# Patient Record
Sex: Female | Born: 1980 | Race: Black or African American | Hispanic: No | Marital: Married | Smoking: Never smoker
Health system: Southern US, Community
[De-identification: ages and names within clinical notes are randomized; demographics above are authoritative.]

## PROBLEM LIST (undated history)

## (undated) DIAGNOSIS — M6080 Other myositis, unspecified site: Secondary | ICD-10-CM

## (undated) DIAGNOSIS — M6089 Other myositis, multiple sites: Secondary | ICD-10-CM

---

## 2016-11-08 DIAGNOSIS — R109 Unspecified abdominal pain: Secondary | ICD-10-CM | POA: Insufficient documentation

## 2016-11-09 ENCOUNTER — Emergency Department (HOSPITAL_COMMUNITY): Payer: 59

## 2016-11-09 ENCOUNTER — Emergency Department (HOSPITAL_COMMUNITY)
Admission: EM | Admit: 2016-11-09 | Discharge: 2016-11-09 | Disposition: A | Discharge status: Home / Self Care | Payer: 59 | Attending: Emergency Medicine | Admitting: Emergency Medicine

## 2016-11-09 ENCOUNTER — Encounter (HOSPITAL_COMMUNITY): Payer: Self-pay | Admitting: Emergency Medicine

## 2016-11-09 DIAGNOSIS — R109 Unspecified abdominal pain: Secondary | ICD-10-CM

## 2016-11-09 HISTORY — DX: Other myositis, multiple sites: M60.89

## 2016-11-09 HISTORY — DX: Other myositis, unspecified site: M60.80

## 2016-11-09 LAB — URINALYSIS, ROUTINE W REFLEX MICROSCOPIC
Bilirubin Urine: NEGATIVE
GLUCOSE, UA: NEGATIVE mg/dL
Ketones, ur: NEGATIVE mg/dL
Leukocytes, UA: NEGATIVE
NITRITE: NEGATIVE
PH: 5 (ref 5.0–8.0)
PROTEIN: NEGATIVE mg/dL
Specific Gravity, Urine: 1.014 (ref 1.005–1.030)

## 2016-11-09 LAB — POC URINE PREG, ED: Preg Test, Ur: NEGATIVE

## 2016-11-09 MED ORDER — PREDNISONE 20 MG PO TABS: 40.0000 mg | ORAL_TABLET | Freq: Every day | ORAL | 0 refills | Status: AC

## 2016-11-09 NOTE — ED Notes (Signed)
Patient transported to X-ray 

## 2016-11-09 NOTE — ED Provider Notes (Signed)
[facility]-EMERGENCY DEPT Provider Note   CSN: [id number] Arrival date & time: 11/08/16  2322  By signing my name below, I, Elder Negus, attest that this documentation has been prepared under the direction and in the presence of Shon Baton, MD. Electronically Signed: Elder Negus, Scribe. 11/09/16. 2:46 AM.   History   Chief Complaint Chief Complaint  Patient presents with  . Flank Pain    radiating to back    HPI Jacqueline Sanford is a 36 y.o. 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 This patient states that 3 days ago she had sudden onset of R flank pain which "radiates into her mid back" and was constant since that time. While in triage she believes that her symptoms have improved greatly and is now only reporting mild pain. She believes her complaints are consistent with her typicals "flares" Of her disease. She also reports bilateral leg discomfort which has been going on for similar amount of time. Currently she states that she is comfortable and much improved. She normally receives his tapers of steroids for her symptoms. She denies any fevers, dysuria, or hematuria. She denies any history of nephrolithiasis.   The history is provided by the patient. No language interpreter was used.    Past Medical History:  Diagnosis Date  . Neuromyositis     There are no active problems to display for this patient.   Past Surgical History:  Procedure Laterality Date  . CESAREAN SECTION      OB History    Gravida Para Term Preterm AB Living   4 4 4  0 0 4   SAB TAB Ectopic Multiple Live Births                   Home Medications    Prior to Admission medications   Medication Sig Start Date End Date Taking? Authorizing Provider  azaTHIOprine (IMURAN) 50 MG tablet Take 50 mg by mouth 3 (three) times daily.   Yes Historical Provider, MD  drospirenone-ethinyl estradiol  (YAZ,GIANVI,LORYNA) 3-0.02 MG tablet Take 1 tablet by mouth daily.   Yes Historical Provider, MD  gabapentin (NEURONTIN) 300 MG capsule Take 300 mg by mouth 4 (four) times daily.   Yes Historical Provider, MD  predniSONE (DELTASONE) 20 MG tablet Take 2 tablets (40 mg total) by mouth daily. 11/09/16   Shon Baton, MD    Family History No family history on file.  Social History Social History  Substance Use Topics  . Smoking status: Never Smoker  . Smokeless tobacco: Never Used  . Alcohol use Yes     Allergies   Patient has no known allergies.   Review of Systems Review of Systems  Constitutional: Negative for fever.  Respiratory: Negative for shortness of breath.   Cardiovascular: Negative for chest pain.  Genitourinary: Positive for flank pain. Negative for difficulty urinating, dysuria and hematuria.  Musculoskeletal: Positive for back pain.  All other systems reviewed and are negative.    Physical Exam Updated Vital Signs BP (!) 148/106 (BP Location: Right Arm)   Pulse 72   Temp 98.8 F (37.1 C) (Oral)   Resp 18   Ht 5' (1.524 m)   Wt 124 lb (56.2 kg)   LMP 10/09/2016 Comment: neg preg. test  SpO2 100%   BMI 24.22 kg/m   Physical Exam  Constitutional: She is oriented to person, place, and time. She appears well-developed and well-nourished. No  distress.  HENT:  Head: Normocephalic and atraumatic.  Cardiovascular: Normal rate, regular rhythm and normal heart sounds.   Pulmonary/Chest: Effort normal. No respiratory distress. She has no wheezes.  Abdominal: Soft. Bowel sounds are normal. There is no tenderness.  Genitourinary:  Genitourinary Comments: No CVA tenderness  Neurological: She is alert and oriented to person, place, and time.  Skin: Skin is warm and dry.  Psychiatric: She has a normal mood and affect.  Nursing note and vitals reviewed.    ED Treatments / Results  DIAGNOSTIC STUDIES: Oxygen Saturation is 97 percent on room air which is  normal by my interpretation.    COORDINATION OF CARE: 2:30 AM Discussed treatment plan with pt at bedside and pt agreed to plan.  Labs (all labs ordered are listed, but only abnormal results are displayed) Labs Reviewed  URINALYSIS, ROUTINE W REFLEX MICROSCOPIC - Abnormal; Notable for the following:       Result Value   Hgb urine dipstick MODERATE (*)    Bacteria, UA RARE (*)    Squamous Epithelial / LPF 0-5 (*)    All other components within normal limits  POC URINE PREG, ED    EKG  EKG Interpretation None       Radiology Dg Abdomen Acute W/chest  Result Date: 11/09/2016 CLINICAL DATA:  Right flank pain radiating to the back for 24 hours. Nausea and vomiting. EXAM: DG ABDOMEN ACUTE W/ 1V CHEST COMPARISON:  None. FINDINGS: Normal heart size and pulmonary vascularity. No focal airspace disease or consolidation in the lungs. No blunting of costophrenic angles. No pneumothorax. Mediastinal contours appear intact. Scattered gas and stool in the colon. No small or large bowel distention. No free intra-abdominal air. No abnormal air-fluid levels. No radiopaque stones. Visualized bones appear intact. Bilateral tubal ligations. IMPRESSION: No evidence of active pulmonary disease. Normal nonobstructive bowel gas pattern. Electronically Signed   By: Burman Nieves M.D.   On: 11/09/2016 03:20    Procedures Procedures (including critical care time)  Medications Ordered in ED Medications - No data to display   Initial Impression / Assessment and Plan / ED Course  I have reviewed the triage vital signs and the nursing notes.  Pertinent labs & imaging results that were available during my care of the patient were reviewed by me and considered in my medical decision making (see chart for details).     Patient presents with right-sided back and flank pain. Reports this is consistent with prior flares of her autoimmune disease and she feels much better. She is otherwise  nontoxic-appearing. Urinalysis without evidence of infection. Acute abdominal series shows no evidence of stone. She has remained asymptomatic while in the ED. I have provided her with a course of prednisone per her request. I have encouraged her to follow-up with her neurologist prior to starting the prednisone. Patient stated understanding.  After history, exam, and medical workup I feel the patient has been appropriately medically screened and is safe for discharge home. Pertinent diagnoses were discussed with the patient. Patient was given return precautions.   Final Clinical Impressions(s) / ED Diagnoses   Final diagnoses:  Right flank pain    New Prescriptions New Prescriptions   PREDNISONE (DELTASONE) 20 MG TABLET    Take 2 tablets (40 mg total) by mouth daily.  *  I personally performed the services described in this documentation, which was scribed in my presence. The recorded information has been reviewed and is accurate.    Shon Baton, MD 11/09/16  0424  

## 2016-11-09 NOTE — ED Triage Notes (Signed)
PMH of NMO auto-immune disorder, pt tonight c/o right flank pain that radiates to back onset x3 days with onset of N/V tonight and pt feels this is related to her NMO immune disorder

## 2016-11-09 NOTE — ED Notes (Signed)
ED Provider at bedside. 

## 2016-11-09 NOTE — Discharge Instructions (Signed)
You were seen today for right back and flank pain. Consult with your neurologist prior to starting prednisone for presumed flare of your autoimmune disease. If you have any new or worsening symptoms she should be reevaluated.

## 2016-11-15 ENCOUNTER — Ambulatory Visit (HOSPITAL_COMMUNITY)
Admission: RE | Admit: 2016-11-15 | Discharge: 2016-11-15 | Disposition: A | Discharge status: Home / Self Care | Payer: 59 | Source: Ambulatory Visit | Attending: Family Medicine | Admitting: Family Medicine

## 2016-11-15 DIAGNOSIS — G35 Multiple sclerosis: Secondary | ICD-10-CM | POA: Diagnosis not present

## 2016-11-15 MED ORDER — SODIUM CHLORIDE 0.9 % IV SOLN
1000.0000 mg | Freq: Once | INTRAVENOUS | Status: AC
  Administered 2016-11-15: 1000 mg via INTRAVENOUS
  Filled 2016-11-15: qty 8

## 2016-11-15 NOTE — Progress Notes (Signed)
Provider: Marcy Siren  Procedure: methylPREDNISolone sodium succinate (SOLU-MEDROL) 1,000 mg in sodium chloride 0.9 % 50 mL IVPB   Diagnosis: Multiple Sclerosis G35  Treatment: Patient received 1,000 mg of Solu-Medrol via IVPB. Patient tolerated procedure well. Discharge instructions given to patient and patient states an understanding. Patient alert, oriented, and ambulatory at time of discharge.

## 2016-11-15 NOTE — Discharge Instructions (Signed)
Pt received Solu Medrol today.

## 2016-11-16 ENCOUNTER — Ambulatory Visit (HOSPITAL_COMMUNITY)
Admission: RE | Admit: 2016-11-16 | Discharge: 2016-11-16 | Disposition: A | Discharge status: Home / Self Care | Payer: 59 | Source: Ambulatory Visit | Attending: Family Medicine | Admitting: Family Medicine

## 2016-11-16 DIAGNOSIS — G35 Multiple sclerosis: Secondary | ICD-10-CM | POA: Diagnosis not present

## 2016-11-16 MED ORDER — SODIUM CHLORIDE 0.9 % IV SOLN
1000.0000 mg | Freq: Once | INTRAVENOUS | Status: AC
  Administered 2016-11-16: 1000 mg via INTRAVENOUS
  Filled 2016-11-16: qty 8

## 2016-11-16 NOTE — Progress Notes (Signed)
Provider: Sun, V  Procedure: methylPREDNISolone sodium succinate (SOLU-MEDROL) 1,000 mg in sodium chloride 0.9 % 50 mL IVPB   Diagnosis: Multiple Sclerosis G35  Treatment: Patient received 1,000 mg of Solu-Medrol via IVPB. Patient tolerated procedure well. Discharge instructions given to patient and patient states an understanding. Patient alert, oriented, and ambulatory at time of discharge.   

## 2016-11-16 NOTE — Discharge Instructions (Signed)
Patient received 1,000 mg of Solu-Medrol via IVPB.

## 2016-11-17 ENCOUNTER — Ambulatory Visit (HOSPITAL_COMMUNITY)
Admission: RE | Admit: 2016-11-17 | Discharge: 2016-11-17 | Disposition: A | Discharge status: Home / Self Care | Payer: 59 | Source: Ambulatory Visit | Attending: Family Medicine | Admitting: Family Medicine

## 2016-11-17 DIAGNOSIS — G35 Multiple sclerosis: Secondary | ICD-10-CM | POA: Diagnosis not present

## 2016-11-17 MED ORDER — METHYLPREDNISOLONE SODIUM SUCC 1000 MG IJ SOLR
1000.0000 mg | Freq: Once | INTRAMUSCULAR | Status: AC
  Administered 2016-11-17: 1000 mg via INTRAVENOUS
  Filled 2016-11-17: qty 8

## 2016-11-17 NOTE — Procedures (Signed)
[facility]  Procedure Note  Legend Carbonaro UEA:[id number] DOB: 1981-01-28 DOA: 11/17/2016   Ordering Provider: Trudie Buckler, MD  Associated Diagnosis: MS G35  Procedure Note: IV infusion of solumedrol, 1000 mg   Condition During Procedure: Pt tolerated well; no complications noted   Condition at Discharge:  Pt alert and oriented; ambulatory; no complications noted   Bluford Kaufmann, RN  [facility]

## 2016-11-18 ENCOUNTER — Ambulatory Visit (HOSPITAL_COMMUNITY)
Admission: RE | Admit: 2016-11-18 | Discharge: 2016-11-18 | Disposition: A | Discharge status: Home / Self Care | Payer: 59 | Source: Ambulatory Visit | Attending: Family Medicine | Admitting: Family Medicine

## 2016-11-18 DIAGNOSIS — G35 Multiple sclerosis: Secondary | ICD-10-CM | POA: Diagnosis not present

## 2016-11-18 MED ORDER — SODIUM CHLORIDE 0.9 % IV SOLN
1000.0000 mg | Freq: Once | INTRAVENOUS | Status: AC
  Administered 2016-11-18: 1000 mg via INTRAVENOUS
  Filled 2016-11-18: qty 8

## 2016-11-18 NOTE — Progress Notes (Addendum)
Provider: Douglas Jeffery, MD  Procedure: methylPREDNISolone sodium succinate (SOLU-MEDROL) 1,000 mg in sodium chloride 0.9 % 50 mL IVPB   Diagnosis: Multiple Sclerosis G35  Treatment: Patient received 1,000 mg of Solu-Medrol via IVPB. Patient tolerated procedure well. Discharge instructions given to patient and patient states an understanding. Patient alert, oriented, and ambulatory at time of discharge. 

## 2016-11-18 NOTE — Discharge Instructions (Signed)
Pt received 1000mg  of SoluMedro today via IV.  It is used for: Acute exacerbations of multiple sclerosis; cerebral edema associated with primary or metastatic brain tumor, or craniotomy.

## 2016-11-21 ENCOUNTER — Ambulatory Visit (HOSPITAL_COMMUNITY)
Admission: RE | Admit: 2016-11-21 | Discharge: 2016-11-21 | Disposition: A | Discharge status: Home / Self Care | Payer: 59 | Source: Ambulatory Visit | Attending: Family Medicine | Admitting: Family Medicine

## 2016-11-21 DIAGNOSIS — G35 Multiple sclerosis: Secondary | ICD-10-CM | POA: Diagnosis not present

## 2016-11-21 MED ORDER — METHYLPREDNISOLONE SODIUM SUCC 1000 MG IJ SOLR
1000.0000 mg | Freq: Once | INTRAMUSCULAR | Status: AC
  Administered 2016-11-21: 1000 mg via INTRAVENOUS
  Filled 2016-11-21: qty 8

## 2016-11-21 NOTE — Progress Notes (Signed)
Provider: Trudie Buckler, MD  Procedure: methylPREDNISolone sodium succinate (SOLU-MEDROL) 1,000 mg in sodium chloride 0.9 % 50 mL IVPB   Diagnosis: Multiple Sclerosis G35  Treatment: Patient received 1,000 mg of Solu-Medrol via IVPB. Patient tolerated procedure well. Discharge instructions given to patient and patient states an understanding. Patient alert, oriented, and ambulatory at time of discharge.

## 2016-11-21 NOTE — Discharge Instructions (Signed)
Pt received 1000 mg of Solu Medro today via piv.

## 2017-01-09 ENCOUNTER — Other Ambulatory Visit: Payer: Self-pay | Admitting: Psychiatry

## 2017-01-09 DIAGNOSIS — G35 Multiple sclerosis: Secondary | ICD-10-CM

## 2017-01-11 ENCOUNTER — Other Ambulatory Visit: Payer: Self-pay | Admitting: Psychiatry

## 2017-01-11 DIAGNOSIS — G35 Multiple sclerosis: Secondary | ICD-10-CM

## 2017-01-20 ENCOUNTER — Ambulatory Visit
Admission: RE | Admit: 2017-01-20 | Discharge: 2017-01-20 | Disposition: A | Discharge status: Home / Self Care | Payer: 59 | Source: Ambulatory Visit | Attending: Psychiatry | Admitting: Psychiatry

## 2017-01-20 DIAGNOSIS — G35 Multiple sclerosis: Secondary | ICD-10-CM

## 2017-01-20 MED ORDER — GADOBENATE DIMEGLUMINE 529 MG/ML IV SOLN
15.0000 mL | Freq: Once | INTRAVENOUS | Status: AC | PRN
  Administered 2017-01-20: 12 mL via INTRAVENOUS

## 2017-01-23 ENCOUNTER — Ambulatory Visit
Admission: RE | Admit: 2017-01-23 | Discharge: 2017-01-23 | Disposition: A | Discharge status: Home / Self Care | Payer: 59 | Source: Ambulatory Visit | Attending: Psychiatry | Admitting: Psychiatry

## 2017-01-23 DIAGNOSIS — G35 Multiple sclerosis: Secondary | ICD-10-CM

## 2017-01-23 MED ORDER — GADOBENATE DIMEGLUMINE 529 MG/ML IV SOLN
12.0000 mL | Freq: Once | INTRAVENOUS | Status: AC | PRN
  Administered 2017-01-23: 12 mL via INTRAVENOUS

## 2017-01-30 ENCOUNTER — Ambulatory Visit (HOSPITAL_COMMUNITY)
Admission: RE | Admit: 2017-01-30 | Discharge: 2017-01-30 | Disposition: A | Discharge status: Home / Self Care | Payer: 59 | Source: Ambulatory Visit | Attending: Family Medicine | Admitting: Family Medicine

## 2017-01-30 DIAGNOSIS — G35 Multiple sclerosis: Secondary | ICD-10-CM | POA: Insufficient documentation

## 2017-01-30 MED ORDER — SODIUM CHLORIDE 0.9 % IV SOLN
1000.0000 mg | Freq: Once | INTRAVENOUS | Status: AC
  Administered 2017-01-30: 1000 mg via INTRAVENOUS
  Filled 2017-01-30: qty 8

## 2017-01-30 NOTE — Discharge Instructions (Signed)
Today, you received an IV infusion of Solumedrol, 1000 mg; Please see referring physician for further instructions.

## 2017-01-30 NOTE — Progress Notes (Signed)
Pt arrived for IV infusion of Solumedrol, 1000 mg; Pt tolerated well; no complications noted  Ordering Provider: Trudie Buckler, MD  Diagnosis: MS exacerbation

## 2017-01-31 ENCOUNTER — Ambulatory Visit (HOSPITAL_COMMUNITY)
Admission: RE | Admit: 2017-01-31 | Discharge: 2017-01-31 | Disposition: A | Discharge status: Home / Self Care | Payer: 59 | Source: Ambulatory Visit | Attending: Family Medicine | Admitting: Family Medicine

## 2017-01-31 DIAGNOSIS — G35 Multiple sclerosis: Secondary | ICD-10-CM | POA: Diagnosis not present

## 2017-01-31 MED ORDER — SODIUM CHLORIDE 0.9 % IV SOLN
1000.0000 mg | Freq: Once | INTRAVENOUS | Status: AC
  Administered 2017-01-31: 1000 mg via INTRAVENOUS
  Filled 2017-01-31: qty 8

## 2017-01-31 NOTE — Progress Notes (Signed)
Ordering Provider: Douglas Jeffery, MD  Procedure: methylPREDNISolone sodium succinate (SOLU-MEDROL) 1,000 mg in sodium chloride 0.9 % 50 mL IVPB   Diagnosis: Multiple Sclerosis G35  Treatment: Patient received 1,000 mg of Solu-Medrol via IVPB. Patient tolerated procedure well. Discharge instructions given to patient and patient states an understanding. Patient alert, oriented, and ambulatory at time of discharge. 

## 2017-01-31 NOTE — Discharge Instructions (Signed)
Patient received Solumedrol via IVPB.

## 2017-02-01 ENCOUNTER — Ambulatory Visit (HOSPITAL_COMMUNITY)
Admission: RE | Admit: 2017-02-01 | Discharge: 2017-02-01 | Disposition: A | Discharge status: Home / Self Care | Payer: 59 | Source: Ambulatory Visit | Attending: Family Medicine | Admitting: Family Medicine

## 2017-02-01 DIAGNOSIS — G35 Multiple sclerosis: Secondary | ICD-10-CM | POA: Diagnosis not present

## 2017-02-01 MED ORDER — SODIUM CHLORIDE 0.9 % IV SOLN
1000.0000 mg | Freq: Once | INTRAVENOUS | Status: AC
  Administered 2017-02-01: 1000 mg via INTRAVENOUS
  Filled 2017-02-01: qty 8

## 2017-02-01 NOTE — Discharge Instructions (Signed)
Patient received methylPREDNISolone sodium succinate 1000 MG via IVPB.

## 2017-02-01 NOTE — Progress Notes (Signed)
Ordering Provider: Trudie Buckler, MD  Procedure: methylPREDNISolone sodium succinate (SOLU-MEDROL) 1,000 mg in sodium chloride 0.9 % 50 mL IVPB   Diagnosis: Multiple Sclerosis G35  Treatment: Patient received 1,000 mg of Solu-Medrol via IVPB. Patient tolerated procedure well. Discharge instructions given to patient and patient states an understanding. Patient alert, oriented, and ambulatory at time of discharge.

## 2017-02-02 ENCOUNTER — Ambulatory Visit (HOSPITAL_COMMUNITY)
Admission: RE | Admit: 2017-02-02 | Discharge: 2017-02-02 | Disposition: A | Discharge status: Home / Self Care | Payer: 59 | Source: Ambulatory Visit | Attending: Family Medicine | Admitting: Family Medicine

## 2017-02-02 DIAGNOSIS — G35 Multiple sclerosis: Secondary | ICD-10-CM | POA: Diagnosis not present

## 2017-02-02 MED ORDER — SODIUM CHLORIDE 0.9 % IV SOLN
1000.0000 mg | Freq: Once | INTRAVENOUS | Status: AC
  Administered 2017-02-02: 1000 mg via INTRAVENOUS
  Filled 2017-02-02: qty 8

## 2017-02-02 NOTE — Discharge Instructions (Signed)
Patient received methylPREDNISolone sodium succinate (SOLU-MEDROL) 1,000 mg in sodium chloride 0.9 % 50 mL IVPB.

## 2017-02-02 NOTE — Progress Notes (Signed)
Ordering Provider: Douglas Jeffery, MD  Procedure: methylPREDNISolone sodium succinate (SOLU-MEDROL) 1,000 mg in sodium chloride 0.9 % 50 mL IVPB   Diagnosis: Multiple Sclerosis G35  Treatment: Patient received 1,000 mg of Solu-Medrol via IVPB. Patient tolerated procedure well. Discharge instructions given to patient and patient states an understanding. Patient alert, oriented, and ambulatory at time of discharge. 

## 2017-02-03 ENCOUNTER — Ambulatory Visit (HOSPITAL_COMMUNITY)
Admission: RE | Admit: 2017-02-03 | Discharge: 2017-02-03 | Disposition: A | Discharge status: Home / Self Care | Payer: 59 | Source: Ambulatory Visit | Attending: Family Medicine | Admitting: Family Medicine

## 2017-02-03 DIAGNOSIS — G35 Multiple sclerosis: Secondary | ICD-10-CM | POA: Diagnosis not present

## 2017-02-03 MED ORDER — SODIUM CHLORIDE 0.9 % IV SOLN
1000.0000 mg | Freq: Once | INTRAVENOUS | Status: AC
  Administered 2017-02-03: 1000 mg via INTRAVENOUS
  Filled 2017-02-03: qty 8

## 2017-02-03 NOTE — Progress Notes (Signed)
Ordering Provider: Douglas Jeffery, MD  Procedure: methylPREDNISolone sodium succinate (SOLU-MEDROL) 1,000 mg in sodium chloride 0.9 % 50 mL IVPB   Diagnosis: Multiple Sclerosis G35  Treatment: Patient received 1,000 mg of Solu-Medrol via IVPB. Patient tolerated procedure well. Discharge instructions given to patient and patient states an understanding. Patient alert, oriented, and ambulatory at time of discharge. 

## 2017-02-03 NOTE — Discharge Instructions (Signed)
Patient received 1000 mg of SoluMedrol today via PIV.

## 2017-03-06 ENCOUNTER — Other Ambulatory Visit: Payer: Self-pay | Admitting: Gastroenterology

## 2017-03-06 DIAGNOSIS — R1013 Epigastric pain: Secondary | ICD-10-CM

## 2017-03-13 ENCOUNTER — Ambulatory Visit
Admission: RE | Admit: 2017-03-13 | Discharge: 2017-03-13 | Disposition: A | Discharge status: Home / Self Care | Payer: 59 | Source: Ambulatory Visit | Attending: Gastroenterology | Admitting: Gastroenterology

## 2017-03-13 DIAGNOSIS — R1013 Epigastric pain: Secondary | ICD-10-CM

## 2017-10-30 DIAGNOSIS — G360 Neuromyelitis optica [Devic]: Secondary | ICD-10-CM | POA: Diagnosis not present

## 2017-11-27 DIAGNOSIS — D729 Disorder of white blood cells, unspecified: Secondary | ICD-10-CM | POA: Diagnosis not present

## 2017-11-27 DIAGNOSIS — Z79899 Other long term (current) drug therapy: Secondary | ICD-10-CM | POA: Diagnosis not present

## 2018-01-10 DIAGNOSIS — Z79899 Other long term (current) drug therapy: Secondary | ICD-10-CM | POA: Diagnosis not present

## 2018-01-10 DIAGNOSIS — D729 Disorder of white blood cells, unspecified: Secondary | ICD-10-CM | POA: Diagnosis not present

## 2018-01-15 DIAGNOSIS — Z9225 Personal history of immunosupression therapy: Secondary | ICD-10-CM | POA: Diagnosis not present

## 2018-01-15 DIAGNOSIS — Z79899 Other long term (current) drug therapy: Secondary | ICD-10-CM | POA: Diagnosis not present

## 2018-01-22 DIAGNOSIS — Z5111 Encounter for antineoplastic chemotherapy: Secondary | ICD-10-CM | POA: Diagnosis not present

## 2018-01-22 DIAGNOSIS — G360 Neuromyelitis optica [Devic]: Secondary | ICD-10-CM | POA: Diagnosis not present

## 2018-01-22 DIAGNOSIS — Z9225 Personal history of immunosupression therapy: Secondary | ICD-10-CM | POA: Diagnosis not present

## 2018-01-22 DIAGNOSIS — Z79899 Other long term (current) drug therapy: Secondary | ICD-10-CM | POA: Diagnosis not present

## 2018-02-05 DIAGNOSIS — G360 Neuromyelitis optica [Devic]: Secondary | ICD-10-CM | POA: Diagnosis not present

## 2018-02-05 DIAGNOSIS — Z5111 Encounter for antineoplastic chemotherapy: Secondary | ICD-10-CM | POA: Diagnosis not present

## 2018-02-11 IMAGING — MR MR HEAD WO/W CM
18 of 20 series · 29 of 48 positions shown · IV contrast (multihance)
Comparison: None available.

CLINICAL DATA: The  Difficulty with ambulation.

EXAM:
MRI HEAD WITHOUT AND WITH CONTRAST
MRI CERVICAL SPINE WITHOUT AND WITH CONTRAST
TECHNIQUE: Multiplanar, multiecho pulse sequences of the brain and surrounding
structures, and cervical spine, to include the craniocervical
junction and cervicothoracic junction, were obtained without and
with intravenous contrast.
CONTRAST:  12mL MULTIHANCE GADOBENATE DIMEGLUMINE 529 MG/ML IV SOLN

[Series 2: T1 · sagittal · 5.0mm · 0.45mm/px · 1 of 21 slices shown (1 of 3)]
[im 1/21]
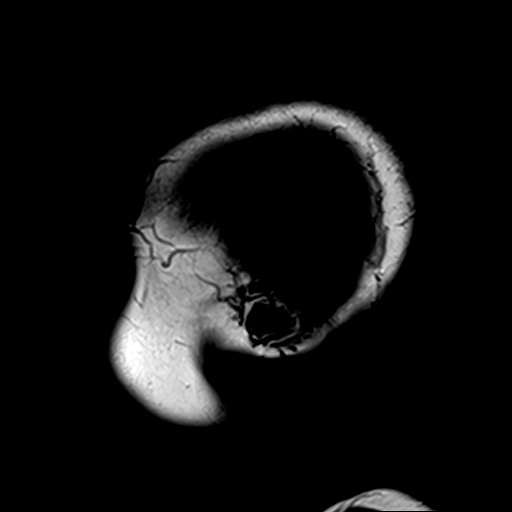

[Series 3: FLAIR · sagittal · 5.0mm · 0.45mm/px · 1 of 25 slices shown (1 of 2)]
[im 1/25]
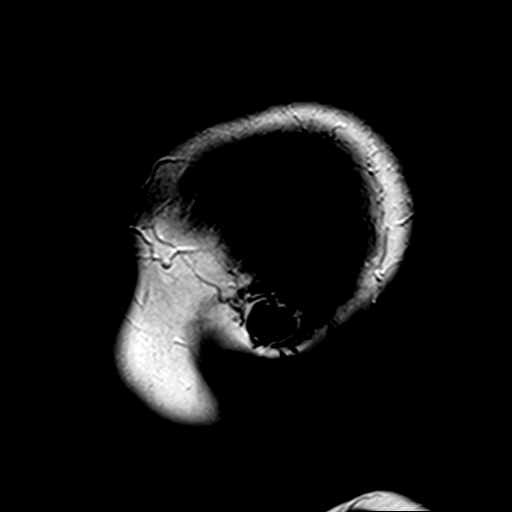

[Series 4: DWI · axial · 3.0mm · 1.80mm/px · z∈[-55,+86]mm · 5 of 96 slices shown (1 of 2)]
[im 1/96]
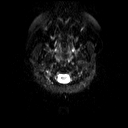
[im 24/96]
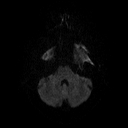
[im 48/96]
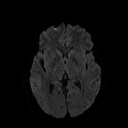
[im 72/96]
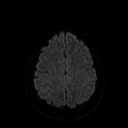
[im 96/96]
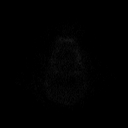

[Series 5: DWI · axial · 3.0mm · 1.80mm/px · z∈[-55,+86]mm · 2 of 48 slices shown (2 of 2)]
[im 1/48]
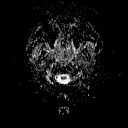
[im 48/48]
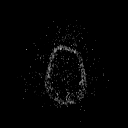

[Series 6: T2 · axial · 5.0mm · 0.51mm/px · 1 of 20 slices shown (1 of 5)]
[im 1/20]
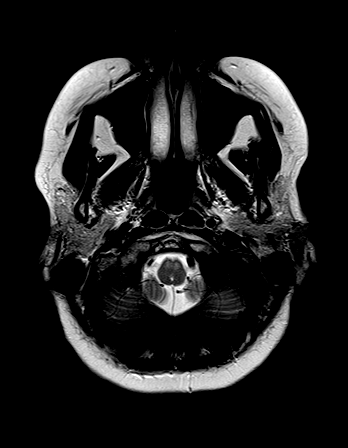

[Series 7: FLAIR · axial · 3.0mm · 0.45mm/px · 1 of 24 slices shown (2 of 2)]
[im 1/24]
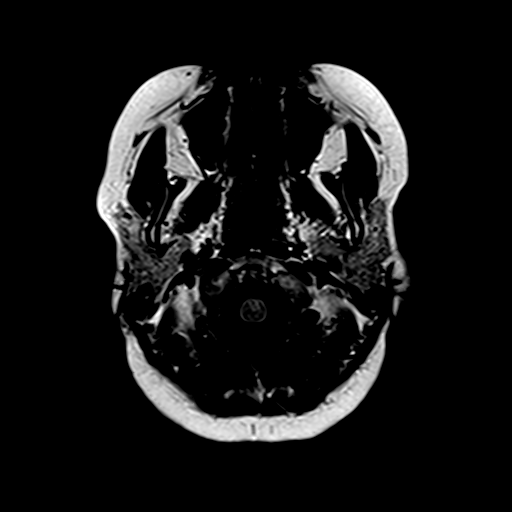

[Series 8: mip_images(sw) · axial · 40.0mm · 0.90mm/px · 1 of 21 slices shown]
[im 1/21]
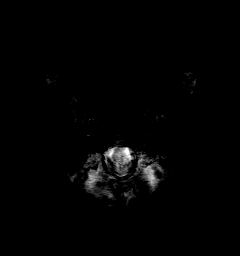

[Series 9: swi_images · axial · 5.0mm · 0.90mm/px · z∈[-52,+82]mm · 2 of 28 slices shown]
[im 1/28]
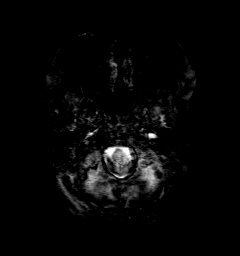
[im 28/28]
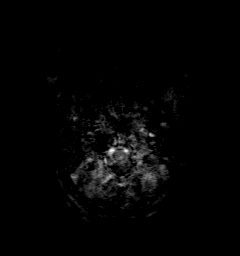

[Series 10: t1_mpr_tra · axial · 1.0mm · 0.75mm/px · 1 of 144 slices shown]
[im 1/144]
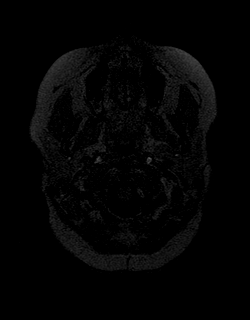

[Series 11: T2 · coronal · 5.0mm · 0.45mm/px · 2 of 27 slices shown (2 of 5)]
[im 1/27]
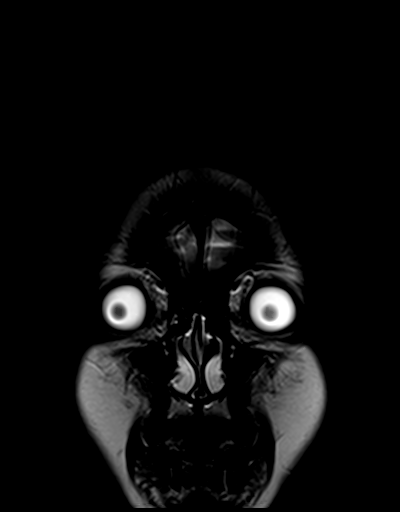
[im 27/27]
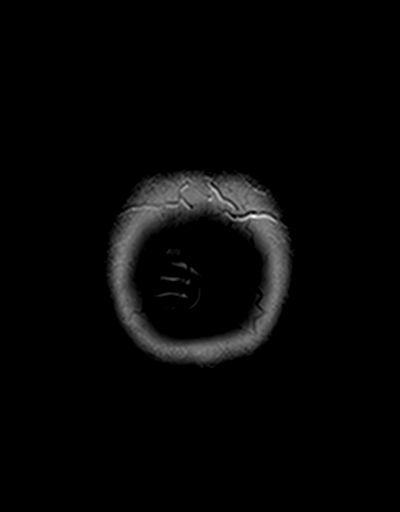

[Series 13: STIR · sagittal · 3.0mm · 0.82mm/px · 1 of 12 slices shown]
[im 1/12]
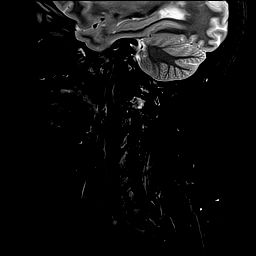

[Series 14: T1 · sagittal · 3.0mm · 0.41mm/px · 1 of 12 slices shown (2 of 3)]
[im 1/12]
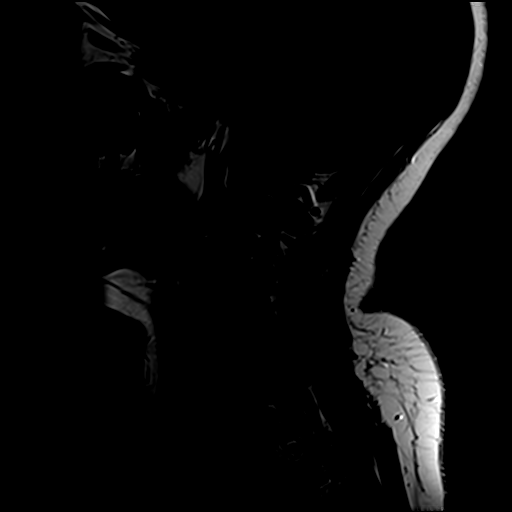

[Series 15: T2 · axial · 3.0mm · 0.39mm/px · z∈[-187,-95]mm · 2 of 26 slices shown (3 of 5)]
[im 1/26]
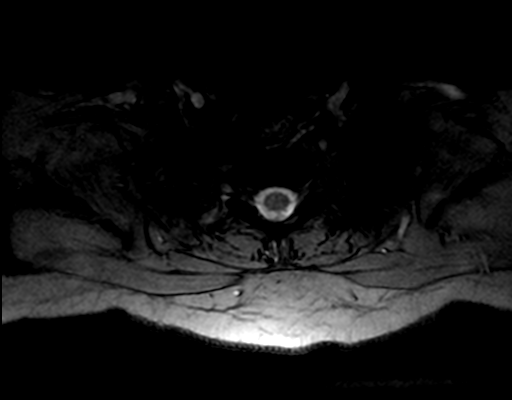
[im 26/26]
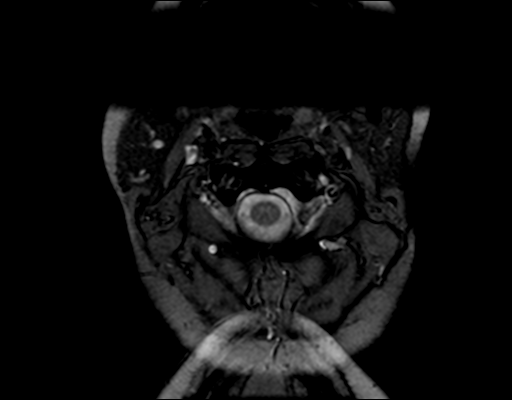

[Series 16: T2 · axial · 3.0mm · 0.39mm/px · z∈[-187,-96]mm · 2 of 26 slices shown (4 of 5)]
[im 1/26]
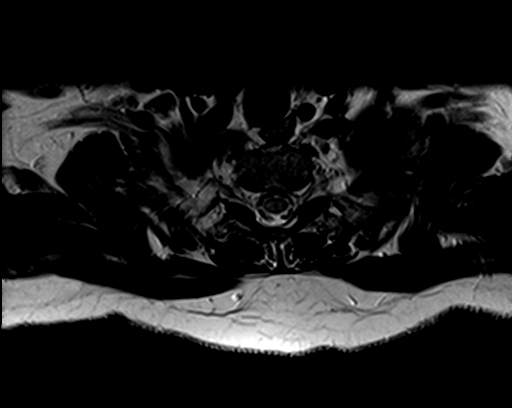
[im 26/26]
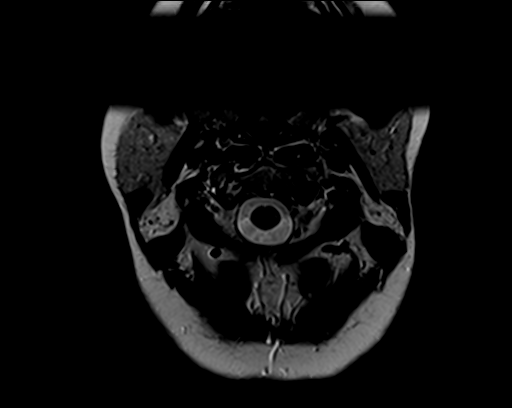

[Series 17: T1 · axial · 3.0mm · 0.35mm/px · z∈[-190,-99]mm · 2 of 26 slices shown (3 of 3)]
[im 1/26]
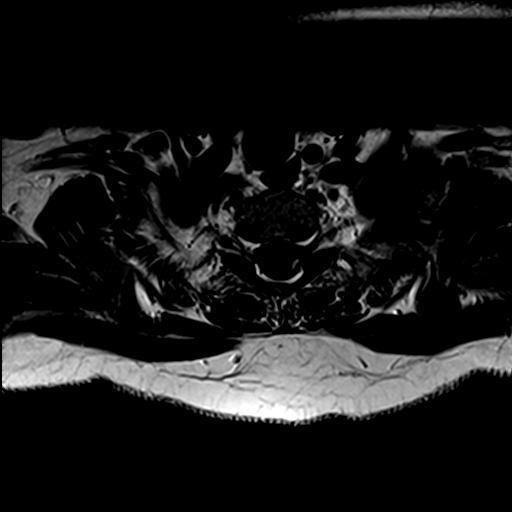
[im 26/26]
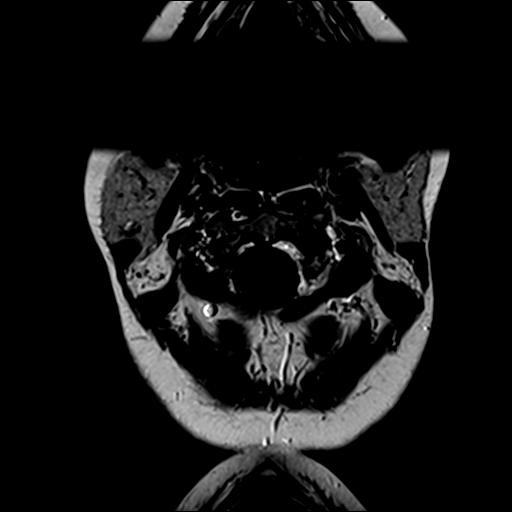

[Series 18: T2 · sagittal · 3.0mm · 0.41mm/px · 1 of 12 slices shown (5 of 5)]
[im 1/12]
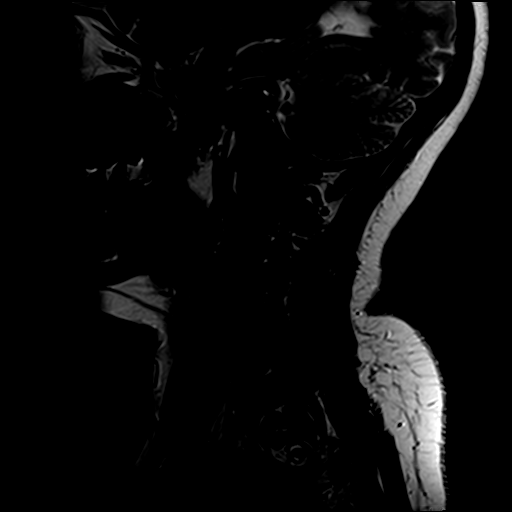

[Series 19: T1 fat-sat post-contrast · sagittal · 3.0mm · 0.82mm/px · 1 of 12 slices shown]
[im 1/12]
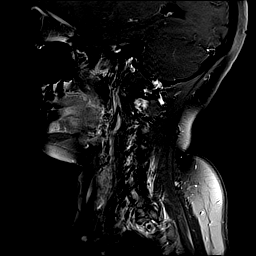

[Series 20: T1 post-contrast · axial · 3.0mm · 0.35mm/px · z∈[-190,-99]mm · 2 of 26 slices shown]
[im 1/26]
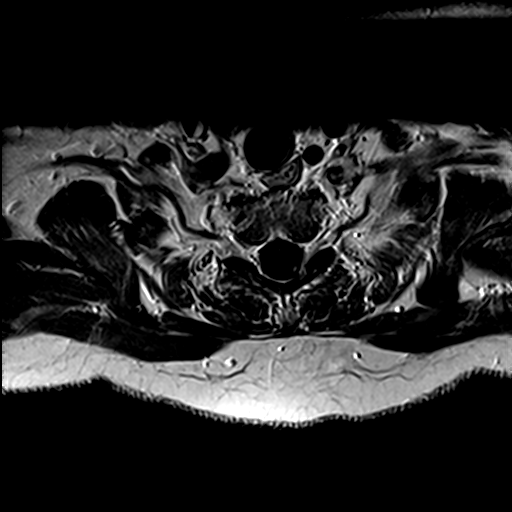
[im 26/26]
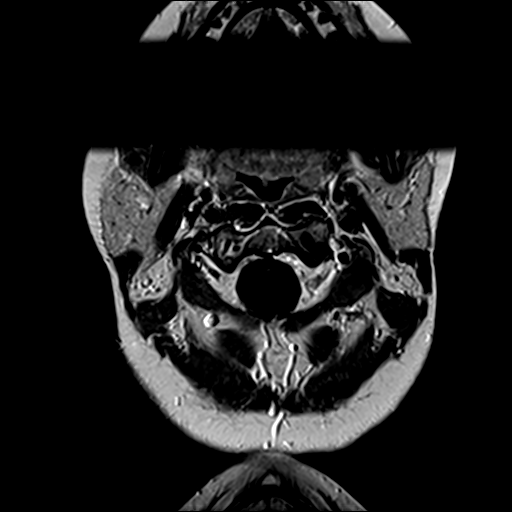

[29 of 48 positions shown; findings below may reference images not displayed]

FINDINGS: MRI HEAD FINDINGS

Brain: Cerebral volume within normal limits for age. Mild scattered
patchy T2/FLAIR signal abnormality seen involving the
periventricular and deep white matter both cerebral hemispheres.
Foci are mildly advanced for a age. Several of these foci oriented
perpendicular to the lateral ventricles (series 3, image 9).
Findings most suggestive of underlying demyelinating disease,
consistent with provided history. No infratentorial lesions
identified. No abnormal restricted diffusion or enhancement to
suggest active demyelination.

No evidence for acute infarct. Gray-white matter differentiation
maintained. No encephalomalacia to suggest chronic infarction. No
foci of susceptibility artifact to suggest acute or chronic
intracranial hemorrhage.

No mass lesion, midline shift or mass effect. Ventricles normal in
size without evidence for hydrocephalus. No extra-axial fluid
collection. Major dural sinuses are patent. No abnormal enhancement.

Pituitary gland and suprasellar region within normal limits. Midline
structures intact and normal.

Vascular: Major intracranial vascular flow voids are well
maintained.

Skull and upper cervical spine: Craniocervical junction normal. Bone
marrow signal intensity within normal limits. No scalp soft tissue
abnormality.

Sinuses/Orbits: Globes and orbital soft tissues within normal
limits. Small retention cysts noted at the right frontoethmoidal
recess. Paranasal sinuses are otherwise clear. No mastoid effusion.
Inner ear structures normal.

Other: No other significant finding.

MRI CERVICAL SPINE FINDINGS

Alignment: Vertebral bodies normally aligned with preservation of
the normal cervical lordosis. No listhesis.

Vertebrae: Vertebral body heights well maintained. No evidence for
acute or chronic fracture. Signal intensity within the vertebral
body bone marrow is normal. No discrete osseous lesions. No abnormal
marrow edema.

Cord: Patchy T2 signal abnormality seen at the cervicomedullary
junction (series 18, image 7). Additional patchy cord signal
abnormality seen throughout much of the cervical spinal cord. Most
notable changes include patchy cord signal at the right dorsal cord
at the level of C2-3 (series 15, image 4). Patchy cord signal
abnormality within the central aspect of the dorsal cord at C3-4
(series 15, images 5, 8). Additional patchy cord signal abnormality
within the right aspect of the cord at C6 (series 16, image 17).
Faint patchy signal abnormality at the level of T1 (series 16, image
26). There is subtle patchy post-contrast enhancement within the
dorsal aspect of the cord at C3-4, suspicious for active
demyelination (series 19, image 7 on sagittal sequence, series 20
knee, image 9 on axial sequence).

Posterior Fossa, vertebral arteries, paraspinal tissues: Visualized
brain grossly unremarkable, better evaluated on dedicated brain
portion of this exam. Paraspinous and prevertebral soft tissues are
normal. Normal intravascular flow voids present within the vertebral
arteries bilaterally.

Disc levels: No significant degenerative changes seen within the
cervical spine. No significant disc bulge. No focal disc protrusion.
No significant canal or neural foraminal stenosis.
IMPRESSION: MRI HEAD IMPRESSION:

1. Mild patchy cerebral white matter changes involving the
periventricular and deep white matter of both cerebral hemispheres,
most consistent with provided history of multiple sclerosis. No
evidence for active demyelination.
2. Otherwise normal brain MRI

MRI CERVICAL SPINE IMPRESSION:

1. Multifocal patchy signal abnormality throughout the cervical
spinal cord as above, consistent with history of multiple sclerosis.
Faint patchy post-contrast enhancement within the dorsal cord at the
level of C3-4 suspicious for active demyelination.
2. Otherwise normal MRI of the cervical spine. No significant
degenerative changes.

## 2018-03-19 DIAGNOSIS — D729 Disorder of white blood cells, unspecified: Secondary | ICD-10-CM | POA: Diagnosis not present

## 2018-03-19 DIAGNOSIS — Z79899 Other long term (current) drug therapy: Secondary | ICD-10-CM | POA: Diagnosis not present

## 2018-06-19 DIAGNOSIS — Z130 Encounter for screening for diseases of the blood and blood-forming organs and certain disorders involving the immune mechanism: Secondary | ICD-10-CM | POA: Diagnosis not present

## 2018-06-19 DIAGNOSIS — Z01419 Encounter for gynecological examination (general) (routine) without abnormal findings: Secondary | ICD-10-CM | POA: Diagnosis not present

## 2018-06-19 DIAGNOSIS — Z124 Encounter for screening for malignant neoplasm of cervix: Secondary | ICD-10-CM | POA: Diagnosis not present

## 2019-03-19 ENCOUNTER — Ambulatory Visit: Payer: 59 | Attending: Neurology

## 2019-03-19 ENCOUNTER — Other Ambulatory Visit: Payer: Self-pay

## 2019-03-19 DIAGNOSIS — M545 Low back pain: Secondary | ICD-10-CM | POA: Diagnosis not present

## 2019-03-19 DIAGNOSIS — G8929 Other chronic pain: Secondary | ICD-10-CM | POA: Insufficient documentation

## 2019-03-19 DIAGNOSIS — G360 Neuromyelitis optica [Devic]: Secondary | ICD-10-CM

## 2019-03-19 NOTE — Therapy (Signed)
[place], [place], [address] Phone: [phone]   Fax:  [phone]  Physical Therapy Evaluation/FCE  Patient Details  Name: Jacqueline Sanford MRN: [id number] Date of Birth: 1981-04-27 Referring Provider (PT): Edrick Oh, Md   Encounter Date: 03/19/2019  PT End of Session - 03/19/19 1631    Visit Number  1    Number of Visits  1    PT Start Time  1250    PT Stop Time  1620    PT Time Calculation (min)  210 min    Activity Tolerance  Patient tolerated treatment well    Behavior During Therapy  [employer] for tasks assessed/performed       Past Medical History:  Diagnosis Date  . Neuromyositis     Past Surgical History:  Procedure Laterality Date  . CESAREAN SECTION      There were no vitals filed for this visit.   Subjective Assessment - 03/19/19 1257    Subjective  She reports understanding of FCE.  She reports MD wants info on restrictions for work.         [facility] PT Assessment - 03/19/19 0001      Assessment   Medical Diagnosis  LBP     Referring Provider (PT)  Edrick Oh, Md    Onset Date/Surgical Date  --   19 years   Next MD Visit  As needed    Prior Therapy  No      Precautions   Precautions  None      Restrictions   Weight Bearing Restrictions  No      Cognition   Overall Cognitive Status  Within Functional Limits for tasks assessed                Objective measurements completed on examination: See above findings.              PT Education - 03/19/19 1630    Education Details  post FCE soreness . REst if able for 1-3 days, reviewed the FCE results    Person(s) Educated  Patient    Methods  Explanation    Comprehension  Verbalized understanding                  Plan - 03/19/19 1633    Clinical Impression Statement  Jacqueline Sanford comleted the FCE testing rated and light duty over an 8 hour day,. Without a job description it is unclear how  this relates to the normal requirements for her work and she works 10-12 hour days  so her demand is greater. As she has not worked much since her onset she may benefit from some conditioning as well as PT for LBP though with a 10 years history progress may be limited.   She stopped to set up an evaluation for her LBP after the FCE    PT Next Visit Plan  Evaluation for LBP when she can get scheduled    Consulted and Agree with Plan of Care  Patient     FCE will be faxed to Dr Glade Nurse  Patient will benefit from skilled therapeutic intervention in order to improve the following deficits and impairments:  Pain  Visit Diagnosis: 1. 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   Darrel Hoover  PT 03/19/2019, 4:39 PM  [facility] [address] [place], [place], [address] Phone: [phone]   Fax:  [phone]  Name: Jacqueline Sanford MRN: [id number] Date of Birth: 12-25-1980

## 2019-04-01 ENCOUNTER — Ambulatory Visit: Payer: 59 | Admitting: Physical Therapy

## 2019-04-11 ENCOUNTER — Other Ambulatory Visit: Payer: Self-pay | Admitting: Family Medicine

## 2019-04-11 DIAGNOSIS — Z20822 Contact with and (suspected) exposure to covid-19: Secondary | ICD-10-CM

## 2019-04-16 ENCOUNTER — Telehealth: Payer: Self-pay | Admitting: *Deleted

## 2019-04-16 LAB — NOVEL CORONAVIRUS, NAA: SARS-CoV-2, NAA: DETECTED — AB

## 2019-04-16 NOTE — Telephone Encounter (Signed)
Pt returned call and requested results of covid-19. Her test results showed detection, so she is positive for the covid-19. Advised to stay quarantine, take medications for the symptoms. Be mindful of other symptoms and to call 911 for respiratory distress. Continue taking vitamins, get fresh air and rest.  Wear a mask when needing to go out and only if necessary. She voiced understanding. Her symptoms now consist of sore throat, cough and loss of taste.

## 2019-05-29 ENCOUNTER — Other Ambulatory Visit: Payer: Self-pay

## 2019-05-29 DIAGNOSIS — Z20822 Contact with and (suspected) exposure to covid-19: Secondary | ICD-10-CM

## 2019-05-30 LAB — NOVEL CORONAVIRUS, NAA: SARS-CoV-2, NAA: NOT DETECTED

## 2021-05-05 ENCOUNTER — Other Ambulatory Visit: Payer: Self-pay

## 2021-05-05 ENCOUNTER — Other Ambulatory Visit: Payer: Self-pay | Admitting: Family Medicine

## 2021-05-05 DIAGNOSIS — G360 Neuromyelitis optica [Devic]: Secondary | ICD-10-CM

## 2021-05-25 ENCOUNTER — Other Ambulatory Visit: Payer: Self-pay

## 2021-05-25 ENCOUNTER — Ambulatory Visit
Admission: RE | Admit: 2021-05-25 | Discharge: 2021-05-25 | Disposition: A | Discharge status: Home / Self Care | Payer: 59 | Source: Ambulatory Visit | Attending: Family Medicine | Admitting: Family Medicine

## 2021-05-25 DIAGNOSIS — G360 Neuromyelitis optica [Devic]: Secondary | ICD-10-CM

## 2021-05-25 MED ORDER — GADOBENATE DIMEGLUMINE 529 MG/ML IV SOLN
11.0000 mL | Freq: Once | INTRAVENOUS | Status: AC | PRN
  Administered 2021-05-25: 11 mL via INTRAVENOUS

## 2021-06-11 ENCOUNTER — Other Ambulatory Visit: Payer: Self-pay | Admitting: Family Medicine

## 2021-06-11 DIAGNOSIS — G360 Neuromyelitis optica [Devic]: Secondary | ICD-10-CM

## 2021-06-12 ENCOUNTER — Ambulatory Visit
Admission: RE | Admit: 2021-06-12 | Discharge: 2021-06-12 | Disposition: A | Discharge status: Home / Self Care | Payer: 59 | Source: Ambulatory Visit | Attending: Family Medicine | Admitting: Family Medicine

## 2021-06-12 DIAGNOSIS — G360 Neuromyelitis optica [Devic]: Secondary | ICD-10-CM

## 2021-06-12 MED ORDER — GADOBENATE DIMEGLUMINE 529 MG/ML IV SOLN
15.0000 mL | Freq: Once | INTRAVENOUS | Status: AC | PRN
  Administered 2021-06-12: 15 mL via INTRAVENOUS

## 2022-12-09 ENCOUNTER — Other Ambulatory Visit: Payer: Self-pay | Admitting: Neurology

## 2022-12-09 DIAGNOSIS — Z79899 Other long term (current) drug therapy: Secondary | ICD-10-CM

## 2022-12-09 DIAGNOSIS — E559 Vitamin D deficiency, unspecified: Secondary | ICD-10-CM

## 2022-12-09 DIAGNOSIS — G360 Neuromyelitis optica [Devic]: Secondary | ICD-10-CM

## 2023-01-02 ENCOUNTER — Other Ambulatory Visit: Payer: Self-pay | Admitting: Neurology

## 2023-01-02 DIAGNOSIS — E559 Vitamin D deficiency, unspecified: Secondary | ICD-10-CM

## 2023-01-02 DIAGNOSIS — G360 Neuromyelitis optica [Devic]: Secondary | ICD-10-CM

## 2023-01-02 DIAGNOSIS — Z79899 Other long term (current) drug therapy: Secondary | ICD-10-CM

## 2023-06-06 ENCOUNTER — Ambulatory Visit
Admission: RE | Admit: 2023-06-06 | Discharge: 2023-06-06 | Disposition: A | Discharge status: Home / Self Care | Payer: 59 | Source: Ambulatory Visit | Attending: Neurology | Admitting: Neurology

## 2023-06-06 DIAGNOSIS — G360 Neuromyelitis optica [Devic]: Secondary | ICD-10-CM

## 2023-06-06 DIAGNOSIS — E559 Vitamin D deficiency, unspecified: Secondary | ICD-10-CM

## 2023-06-06 DIAGNOSIS — Z79899 Other long term (current) drug therapy: Secondary | ICD-10-CM

## 2023-06-06 MED ORDER — GADOPICLENOL 0.5 MMOL/ML IV SOLN
7.5000 mL | Freq: Once | INTRAVENOUS | Status: AC | PRN
  Administered 2023-06-06: 6 mL via INTRAVENOUS
